# Patient Record
Sex: Female | Born: 1975 | Race: White | Hispanic: No | Smoking: Never smoker
Health system: Southern US, Community
[De-identification: ages and names within clinical notes are randomized; demographics above are authoritative.]

---

## 2003-08-13 ENCOUNTER — Other Ambulatory Visit: Admission: RE | Admit: 2003-08-13 | Discharge: 2003-08-13 | Payer: Self-pay | Admitting: Obstetrics and Gynecology

## 2005-08-23 ENCOUNTER — Inpatient Hospital Stay (HOSPITAL_COMMUNITY): Admission: AD | Admit: 2005-08-23 | Discharge: 2005-08-26 | Payer: Self-pay | Admitting: Obstetrics and Gynecology

## 2008-11-11 ENCOUNTER — Inpatient Hospital Stay (HOSPITAL_COMMUNITY): Admission: AD | Admit: 2008-11-11 | Discharge: 2008-11-13 | Payer: Self-pay | Admitting: Obstetrics & Gynecology

## 2010-09-19 LAB — CBC
Hemoglobin: 12.9 g/dL (ref 12.0–15.0)
MCV: 95.3 fL (ref 78.0–100.0)
RBC: 3.02 MIL/uL — ABNORMAL LOW (ref 3.87–5.11)
RBC: 3.89 MIL/uL (ref 3.87–5.11)
RDW: 12.6 % (ref 11.5–15.5)
WBC: 11.7 10*3/uL — ABNORMAL HIGH (ref 4.0–10.5)

## 2013-01-16 ENCOUNTER — Ambulatory Visit: Payer: Self-pay

## 2013-07-03 ENCOUNTER — Ambulatory Visit: Payer: Self-pay

## 2013-07-03 LAB — RAPID STREP-A WITH REFLX: Micro Text Report: NEGATIVE

## 2013-07-06 LAB — BETA STREP CULTURE(ARMC)

## 2015-10-28 ENCOUNTER — Ambulatory Visit
Admission: EM | Admit: 2015-10-28 | Discharge: 2015-10-28 | Disposition: A | Discharge status: Home / Self Care | Payer: BLUE CROSS/BLUE SHIELD | Attending: Emergency Medicine | Admitting: Emergency Medicine

## 2015-10-28 ENCOUNTER — Encounter: Payer: Self-pay | Admitting: Gynecology

## 2015-10-28 DIAGNOSIS — J302 Other seasonal allergic rhinitis: Secondary | ICD-10-CM | POA: Diagnosis not present

## 2015-10-28 MED ORDER — NAPROXEN 500 MG PO TABS: 500.0000 mg | ORAL_TABLET | Freq: Two times a day (BID) | ORAL | Status: AC

## 2015-10-28 NOTE — ED Provider Notes (Signed)
CSN: [id number]     Arrival date & time 10/28/15  1507 History   First MD Initiated Contact with Patient 10/28/15 1611     Chief Complaint  Patient presents with  . Sinusitis   (Consider location/radiation/quality/duration/timing/severity/associated sxs/prior Treatment) HPI  40 year old female who presents temporal  ear pain started about a week to week and a half ago and has since moved to over her eyes radiating back into her occipital area. She denies any neurological symptoms specifically no visual disturbances no clumsiness no numbness or tingling or concentration difficulties. His had no sinuses that have been running. Denies any coughing. She's had a temperature as high as 99.8 is afebrile today at 98.1. She has recently moved into a rental home and has a lot of grass and trees around. They are awaiting construction of their new home in [place]. She's been using Mucinex sinus but has noticed no significant relief. She has also tried some nonsteroidal anti-inflammatory drugs also of no benefit.      History reviewed. No pertinent past medical history. History reviewed. No pertinent past surgical history. No family history on file. Social History  Substance Use Topics  . Smoking status: Never Smoker   . Smokeless tobacco: None  . Alcohol Use: No   OB History    No data available     Review of Systems  Constitutional: Negative for fever, chills, activity change and fatigue.  HENT: Positive for congestion, ear pain and sinus pressure.   Respiratory: Negative for cough and shortness of breath.   All other systems reviewed and are negative.   Allergies  Review of patient's allergies indicates no known allergies.  Home Medications   Prior to Admission medications   Medication Sig Start Date End Date Taking? Authorizing Provider  naproxen (NAPROSYN) 500 MG tablet Take 1 tablet (500 mg total) by mouth 2 (two) times daily with a meal. 10/28/15   Lutricia Feil, PA-C   Meds  Ordered and Administered this Visit  Medications - No data to display  BP 140/75 mmHg  Pulse 93  Temp(Src) 98.1 F (36.7 C) (Oral)  Resp 16  Ht  (1.626 m)  Wt 145 lb (65.772 kg)  BMI 24.88 kg/m2  SpO2 100%  LMP 10/05/2015 No data found.   Physical Exam  Constitutional: She is oriented to person, place, and time. She appears well-developed and well-nourished. No distress.  HENT:  Head: Normocephalic and atraumatic.  Right Ear: External ear normal.  Left Ear: External ear normal.  Nose: Nose normal.  Mouth/Throat: Oropharynx is clear and moist. No oropharyngeal exudate.  Oropharynx is benign. There is no tenderness to percussion over the sinuses  Eyes: Conjunctivae are normal. Pupils are equal, round, and reactive to light.  Neck: Normal range of motion. Neck supple.  Pulmonary/Chest: Effort normal and breath sounds normal. No stridor. No respiratory distress. She has no wheezes. She has no rales.  Musculoskeletal: Normal range of motion. She exhibits no edema or tenderness.  Lymphadenopathy:    She has no cervical adenopathy.  Neurological: She is alert and oriented to person, place, and time. She exhibits normal muscle tone. Coordination normal.  Skin: Skin is warm and dry. She is not diaphoretic.  Psychiatric: She has a normal mood and affect. Her behavior is normal. Judgment and thought content normal.  Nursing note and vitals reviewed.   ED Course  Procedures (including critical care time)  Labs Review Labs Reviewed - No data to display  Imaging Review No results  found.   Visual Acuity Review  Right Eye Distance:   Left Eye Distance:   Bilateral Distance:    Right Eye Near:   Left Eye Near:    Bilateral Near:         MDM   1. Seasonal allergies    There are no discharge medications for this patient. Plan: 1. Test/x-ray results and diagnosis reviewed with patient 2. rx as per orders; risks, benefits, potential side effects reviewed with  patient 3. Recommend supportive treatment with Use of a Nettie pot followed by Flonase on a daily basis. Also start her on Zyrtec on a daily basis. I told her I find no assist for sinusitis on today's examination and there are no neurological signs or symptoms at the present time. Recently moved into a rental home surrounded by a grass and trees she may be reacting records causing her symptoms. She has noticed her eyes have been burning matting someone in the morning watering. If she is not improving in a couple of weeks I recommended that she be seen an ENT for further evaluation and possible testing. I have given her a prescription for Naprosyn that she will take twice a day for any headache pain that she may have. Cautioned her to take this with food in her stomach. She develops any neurological signs or symptoms to need to go the emergency room immediately. She does not have a primary care physician and uses her OB/GYN exclusively. 4. F/u prn if symptoms worsen or don't improve     Lutricia FeilWilliam P Roemer, PA-C 10/28/15 1656

## 2015-10-28 NOTE — Discharge Instructions (Signed)

## 2015-10-28 NOTE — ED Notes (Signed)
Patient c/o sinus / headache pressure x over a week. Per patient will get tension head ace at times.

## 2019-08-10 ENCOUNTER — Ambulatory Visit: Payer: Self-pay | Attending: Internal Medicine

## 2019-08-10 DIAGNOSIS — Z23 Encounter for immunization: Secondary | ICD-10-CM | POA: Insufficient documentation

## 2019-08-10 NOTE — Progress Notes (Signed)
   Covid-19 Vaccination Clinic  Name:  Jaime Holder    MRN: [id number] DOB: 1976-05-07  08/10/2019  Ms. Jaime Holder was observed post Covid-19 immunization for 15 minutes without incidence. She was provided with Vaccine Information Sheet and instruction to access the V-Safe system.   Ms. Jaime Holder was instructed to call 911 with any severe reactions post vaccine: Marland Kitchen Difficulty breathing  . Swelling of your face and throat  . A fast heartbeat  . A bad rash all over your body  . Dizziness and weakness    Immunizations Administered    Name Date Dose VIS Date Route   Pfizer COVID-19 Vaccine 08/10/2019 12:46 PM 0.3 mL 05/23/2019 Intramuscular   Manufacturer: [employer], [employer]   Lot: [id number]   NDC: [address]-9

## 2019-09-02 ENCOUNTER — Ambulatory Visit: Payer: Self-pay | Attending: Internal Medicine

## 2019-09-02 DIAGNOSIS — Z23 Encounter for immunization: Secondary | ICD-10-CM

## 2019-09-02 NOTE — Progress Notes (Signed)
   Covid-19 Vaccination Clinic  Name:  MAKHAYLA MCMURRY    MRN: [id number] DOB: 07-22-1975  09/02/2019  Ms. Dawna Part was observed post Covid-19 immunization for 15 minutes without incident. She was provided with Vaccine Information Sheet and instruction to access the V-Safe system.   Ms. Dawna Part was instructed to call 911 with any severe reactions post vaccine: Marland Kitchen Difficulty breathing  . Swelling of face and throat  . A fast heartbeat  . A bad rash all over body  . Dizziness and weakness   Immunizations Administered    Name Date Dose VIS Date Route   Pfizer COVID-19 Vaccine 09/02/2019  2:53 PM 0.3 mL 05/23/2019 Intramuscular   Manufacturer: [employer], [employer]   Lot: [id number]   NDC: [address]-9

## 2020-03-29 ENCOUNTER — Other Ambulatory Visit: Payer: Self-pay | Admitting: Family Medicine

## 2020-03-29 DIAGNOSIS — Z1231 Encounter for screening mammogram for malignant neoplasm of breast: Secondary | ICD-10-CM

## 2020-04-13 ENCOUNTER — Ambulatory Visit: Payer: Self-pay

## 2020-04-13 ENCOUNTER — Other Ambulatory Visit: Payer: Self-pay

## 2020-04-13 ENCOUNTER — Ambulatory Visit
Admission: RE | Admit: 2020-04-13 | Discharge: 2020-04-13 | Disposition: A | Discharge status: Home / Self Care | Payer: BC Managed Care – PPO | Source: Ambulatory Visit | Attending: Family Medicine | Admitting: Family Medicine

## 2020-04-13 DIAGNOSIS — Z1231 Encounter for screening mammogram for malignant neoplasm of breast: Secondary | ICD-10-CM | POA: Insufficient documentation

## 2020-04-21 ENCOUNTER — Other Ambulatory Visit: Payer: Self-pay | Admitting: Family Medicine

## 2020-04-21 DIAGNOSIS — R928 Other abnormal and inconclusive findings on diagnostic imaging of breast: Secondary | ICD-10-CM

## 2020-04-21 DIAGNOSIS — N6489 Other specified disorders of breast: Secondary | ICD-10-CM

## 2020-04-22 ENCOUNTER — Ambulatory Visit
Admission: RE | Admit: 2020-04-22 | Discharge: 2020-04-22 | Disposition: A | Discharge status: Home / Self Care | Payer: BC Managed Care – PPO | Source: Ambulatory Visit | Attending: Family Medicine | Admitting: Family Medicine

## 2020-04-22 ENCOUNTER — Other Ambulatory Visit: Payer: Self-pay

## 2020-04-22 DIAGNOSIS — N6489 Other specified disorders of breast: Secondary | ICD-10-CM | POA: Diagnosis present

## 2020-04-22 DIAGNOSIS — R928 Other abnormal and inconclusive findings on diagnostic imaging of breast: Secondary | ICD-10-CM | POA: Insufficient documentation

## 2021-04-04 ENCOUNTER — Other Ambulatory Visit: Payer: Self-pay | Admitting: Family Medicine

## 2021-04-04 DIAGNOSIS — Z1231 Encounter for screening mammogram for malignant neoplasm of breast: Secondary | ICD-10-CM

## 2021-07-08 ENCOUNTER — Other Ambulatory Visit: Payer: Self-pay

## 2021-07-08 ENCOUNTER — Ambulatory Visit
Admission: RE | Admit: 2021-07-08 | Discharge: 2021-07-08 | Disposition: A | Discharge status: Home / Self Care | Payer: BC Managed Care – PPO | Source: Ambulatory Visit | Attending: Family Medicine | Admitting: Family Medicine

## 2021-07-08 DIAGNOSIS — Z1231 Encounter for screening mammogram for malignant neoplasm of breast: Secondary | ICD-10-CM | POA: Insufficient documentation

## 2022-06-08 ENCOUNTER — Other Ambulatory Visit: Payer: Self-pay | Admitting: Family Medicine

## 2022-06-08 DIAGNOSIS — Z1231 Encounter for screening mammogram for malignant neoplasm of breast: Secondary | ICD-10-CM

## 2022-07-11 ENCOUNTER — Ambulatory Visit
Admission: RE | Admit: 2022-07-11 | Discharge: 2022-07-11 | Disposition: A | Discharge status: Home / Self Care | Payer: BC Managed Care – PPO | Source: Ambulatory Visit | Attending: Family Medicine | Admitting: Family Medicine

## 2022-07-11 DIAGNOSIS — Z1231 Encounter for screening mammogram for malignant neoplasm of breast: Secondary | ICD-10-CM | POA: Insufficient documentation

## 2023-07-02 ENCOUNTER — Other Ambulatory Visit: Payer: Self-pay | Admitting: Family Medicine

## 2023-07-02 DIAGNOSIS — Z1231 Encounter for screening mammogram for malignant neoplasm of breast: Secondary | ICD-10-CM

## 2023-08-10 ENCOUNTER — Encounter: Payer: Self-pay | Admitting: Radiology

## 2023-08-10 ENCOUNTER — Ambulatory Visit
Admission: RE | Admit: 2023-08-10 | Discharge: 2023-08-10 | Disposition: A | Discharge status: Home / Self Care | Payer: 59 | Source: Ambulatory Visit | Attending: Family Medicine | Admitting: Family Medicine

## 2023-08-10 DIAGNOSIS — Z1231 Encounter for screening mammogram for malignant neoplasm of breast: Secondary | ICD-10-CM | POA: Diagnosis present

## 2023-10-26 IMAGING — MG MM DIGITAL SCREENING BILAT W/ TOMO AND CAD
8 series · 9 of 24 positions shown · non-contrast
Comparison: Previous exam(s).

CLINICAL DATA: Screening.

EXAM:
DIGITAL SCREENING BILATERAL MAMMOGRAM WITH TOMOSYNTHESIS AND CAD
TECHNIQUE: Bilateral screening digital craniocaudal and mediolateral oblique
mammograms were obtained. Bilateral screening digital breast
tomosynthesis was performed. The images were evaluated with
computer-aided detection.

[L MLO synth-2D]
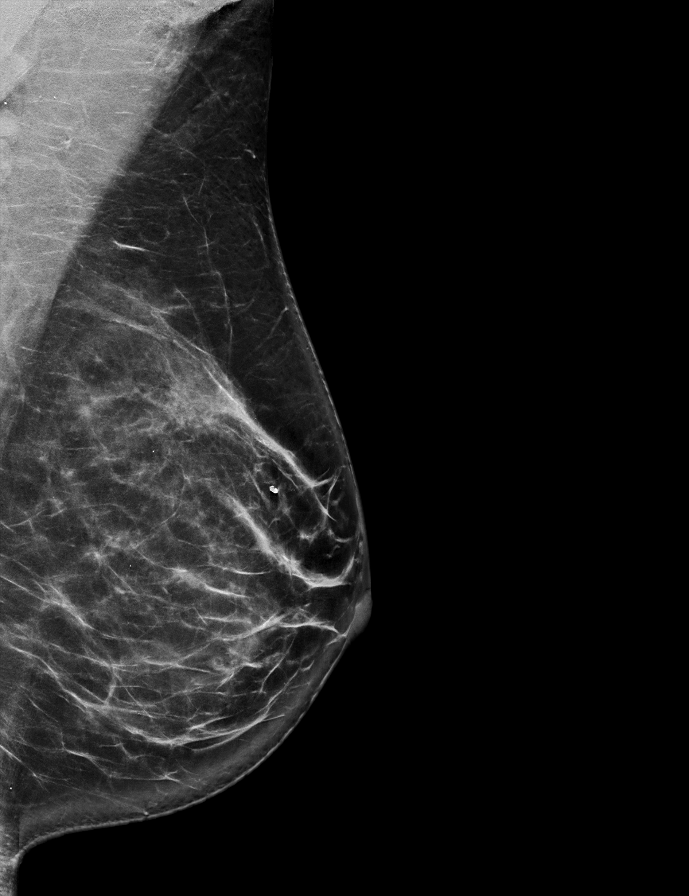

[R CC synth-2D]
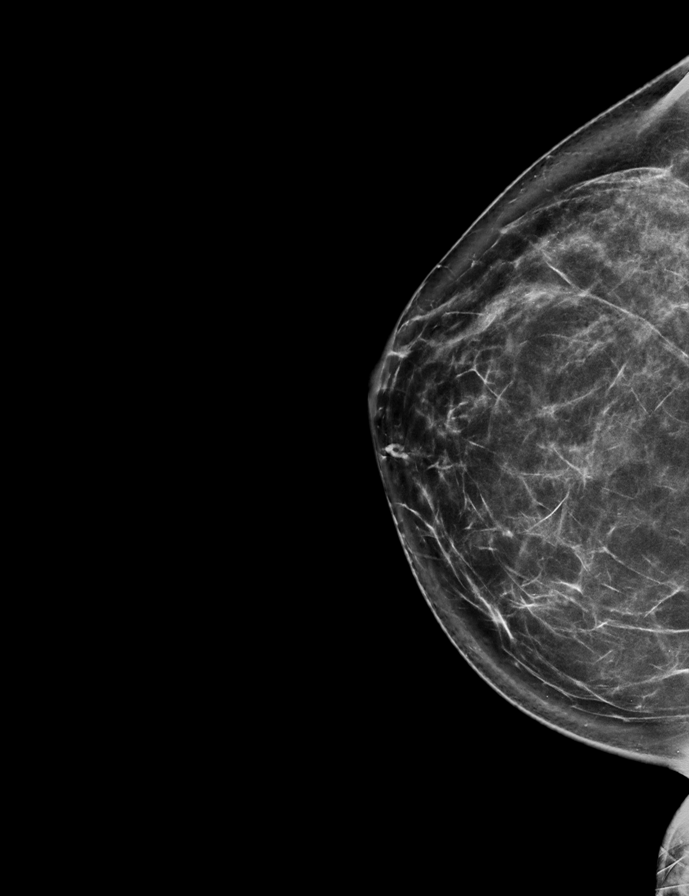

[L CC synth-2D]
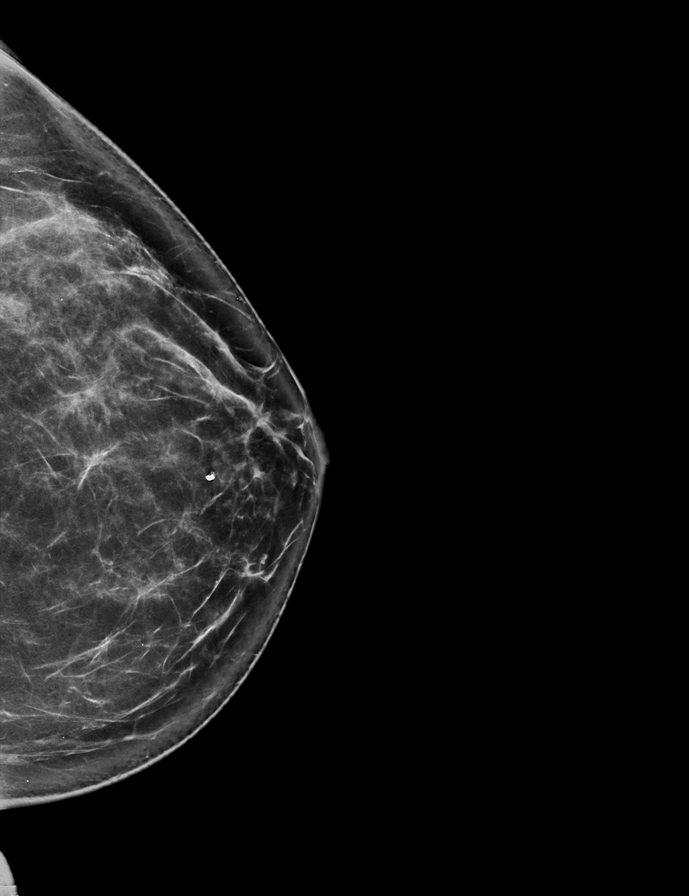

[R MLO synth-2D]
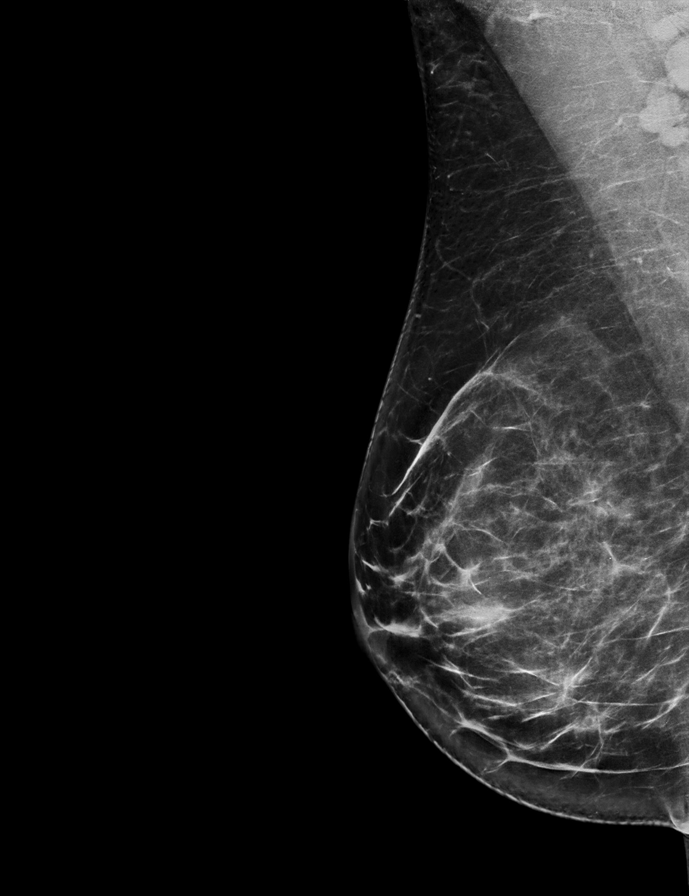

[L MLO tomo · 2 of 82 frames shown]
[frame 27/82]
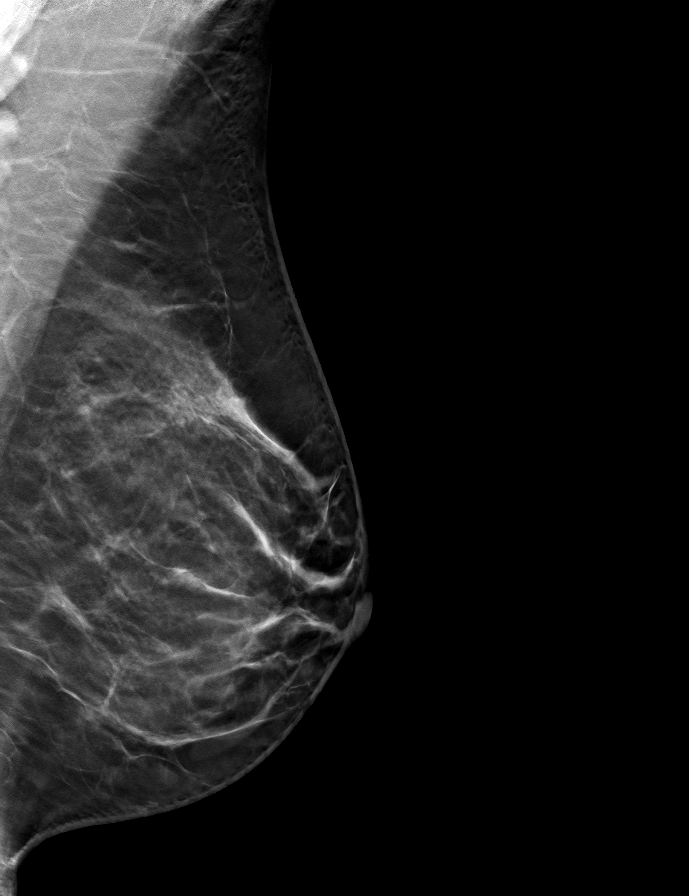
[frame 41/82]
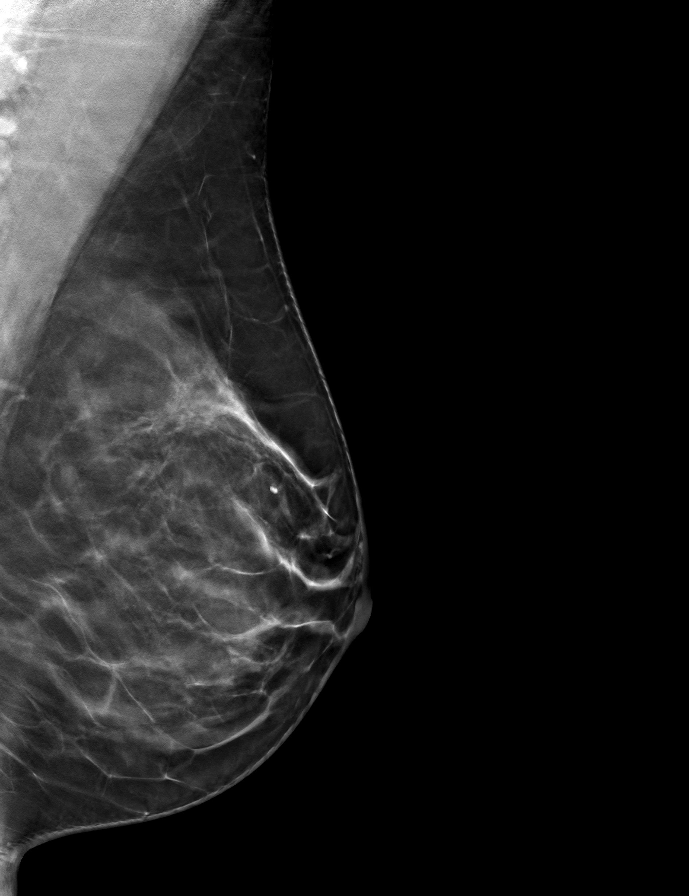

[R CC tomo · tomo slice 41/80.0]
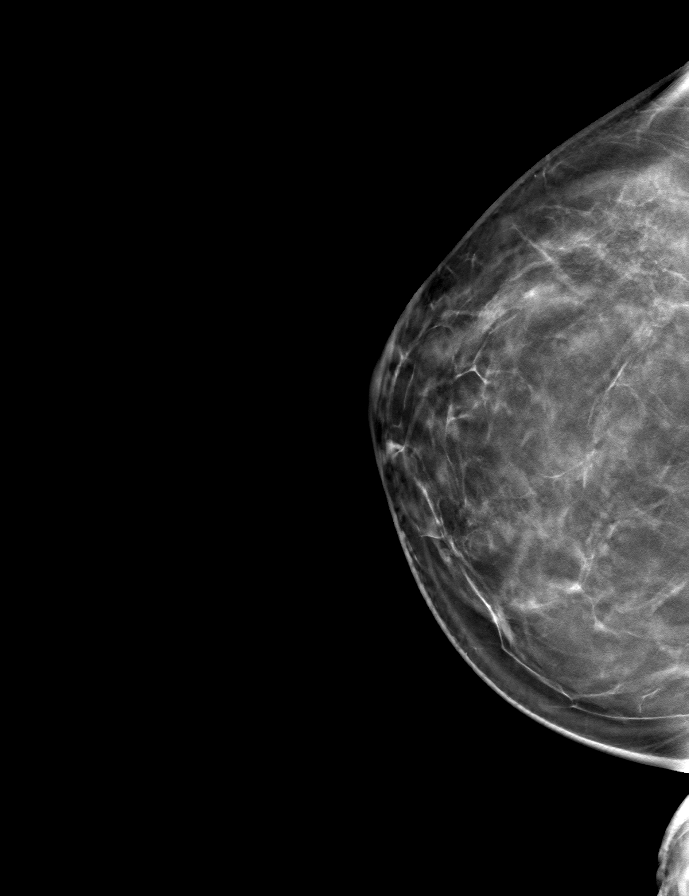

[L CC tomo · tomo slice 41/81.0]
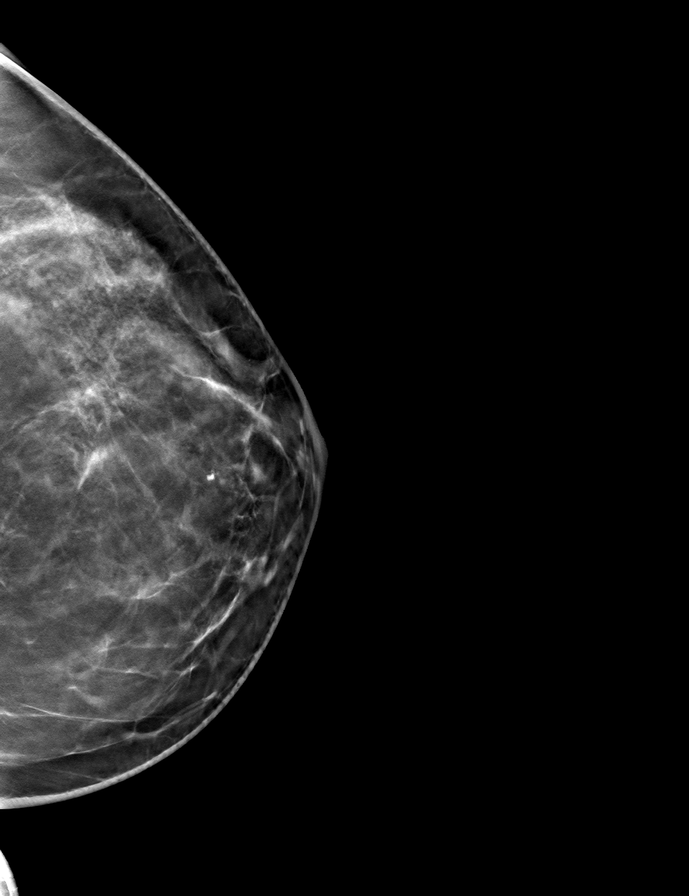

[R MLO tomo · tomo slice 41/82.0]
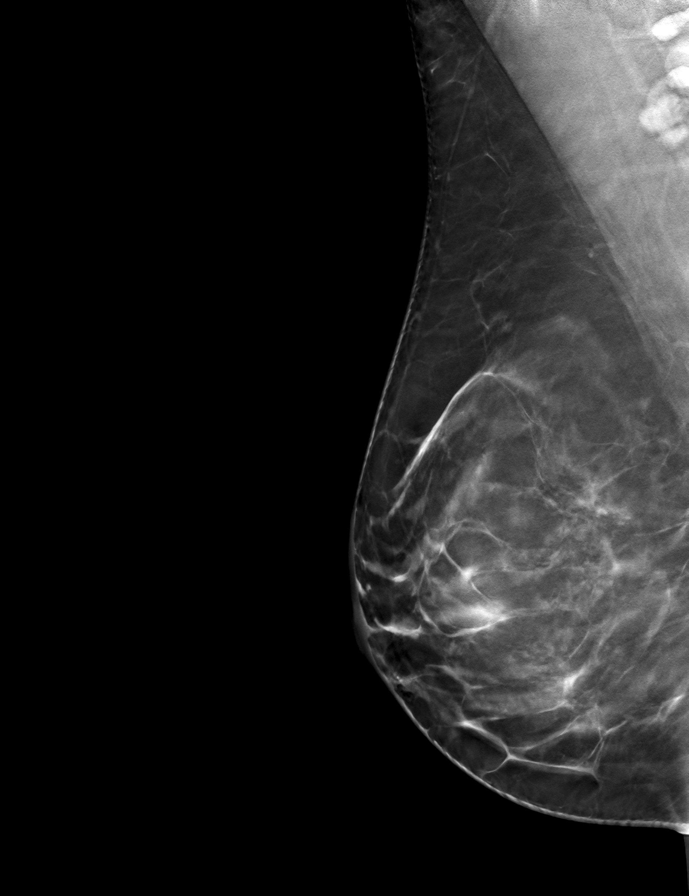

[9 of 24 positions shown; findings below may reference images not displayed]

ACR Breast Density Category b: There are scattered areas of
fibroglandular density.
FINDINGS: There are no findings suspicious for malignancy.
IMPRESSION: No mammographic evidence of malignancy. A result letter of this
screening mammogram will be mailed directly to the patient.

RECOMMENDATION:
Screening mammogram in one year. (Code:51-O-LD2)

BI-RADS CATEGORY  1: Negative.

## 2023-11-23 ENCOUNTER — Ambulatory Visit: Payer: Self-pay

## 2023-11-23 DIAGNOSIS — Z09 Encounter for follow-up examination after completed treatment for conditions other than malignant neoplasm: Secondary | ICD-10-CM | POA: Diagnosis present

## 2023-11-23 DIAGNOSIS — K640 First degree hemorrhoids: Secondary | ICD-10-CM | POA: Diagnosis not present

## 2023-11-23 DIAGNOSIS — Z1211 Encounter for screening for malignant neoplasm of colon: Secondary | ICD-10-CM | POA: Diagnosis not present

## 2023-11-23 DIAGNOSIS — Z83719 Family history of colon polyps, unspecified: Secondary | ICD-10-CM | POA: Diagnosis not present

## 2023-11-23 DIAGNOSIS — K5730 Diverticulosis of large intestine without perforation or abscess without bleeding: Secondary | ICD-10-CM | POA: Diagnosis not present

## 2024-07-15 ENCOUNTER — Other Ambulatory Visit: Payer: Self-pay | Admitting: Family Medicine

## 2024-07-15 DIAGNOSIS — Z1231 Encounter for screening mammogram for malignant neoplasm of breast: Secondary | ICD-10-CM

## 2024-09-15 ENCOUNTER — Encounter
